# Patient Record
Sex: Female | Born: 1980 | Race: White | Hispanic: No | Marital: Married | Smoking: Never smoker
Health system: Southern US, Community
[De-identification: ages and names within clinical notes are randomized; demographics above are authoritative.]

## PROBLEM LIST (undated history)

## (undated) DIAGNOSIS — F419 Anxiety disorder, unspecified: Secondary | ICD-10-CM

## (undated) DIAGNOSIS — R32 Unspecified urinary incontinence: Secondary | ICD-10-CM

## (undated) HISTORY — PX: CHOLECYSTECTOMY: SHX55

## (undated) HISTORY — PX: EYE SURGERY: SHX253

## (undated) HISTORY — PX: TUBAL LIGATION: SHX77

## (undated) HISTORY — PX: HERNIA REPAIR: SHX51

---

## 2007-01-07 ENCOUNTER — Ambulatory Visit (HOSPITAL_COMMUNITY): Admission: RE | Admit: 2007-01-07 | Discharge: 2007-01-07 | Payer: Self-pay | Admitting: Optometry

## 2007-01-07 ENCOUNTER — Ambulatory Visit: Payer: Self-pay | Admitting: Internal Medicine

## 2007-01-19 ENCOUNTER — Encounter: Admission: RE | Admit: 2007-01-19 | Discharge: 2007-01-19 | Payer: Self-pay | Admitting: Emergency Medicine

## 2007-02-04 ENCOUNTER — Ambulatory Visit: Payer: Self-pay | Admitting: Internal Medicine

## 2007-08-03 ENCOUNTER — Ambulatory Visit (HOSPITAL_COMMUNITY): Admission: RE | Admit: 2007-08-03 | Discharge: 2007-08-03 | Payer: Self-pay | Admitting: Gastroenterology

## 2007-11-17 ENCOUNTER — Ambulatory Visit (HOSPITAL_COMMUNITY): Admission: RE | Admit: 2007-11-17 | Discharge: 2007-11-17 | Payer: Self-pay | Admitting: General Surgery

## 2007-11-17 ENCOUNTER — Encounter (INDEPENDENT_AMBULATORY_CARE_PROVIDER_SITE_OTHER): Payer: Self-pay | Admitting: General Surgery

## 2008-09-11 IMAGING — NM NM HEPATO W/GB/PHARM/[PERSON_NAME]
3 series · 13 of 13 positions shown · non-contrast
Comparison: Abdominal CT 01/19/2007

CLINICAL DATA: Abdominal pain with nausea

NUCLEAR MEDICINE HEPATOBILIARY IMAGING WITH/WITHOUT GALLBLADDER
EJECTION FRACTION.
TECHNIQUE: Sequential abdominal images were obtained following
intravenous injection of radiopharmaceutical.  Sequential images
were continued following the oral administration of 8 ounces of
half-and-half cream and a gallbladder ejection fraction was
calculated.
Radiopharmaceutical: 5 mCi technetium 99m Choletec.

[Series 1: he hepatobiliary · 3.22mm/px · 6 of 57 frames shown (1 of 3)]
[frame 5/57]
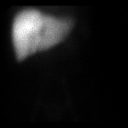
[frame 15/57]
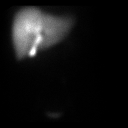
[frame 24/57]
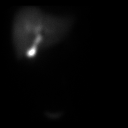
[frame 34/57]
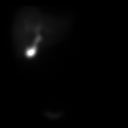
[frame 43/57]
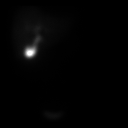
[frame 53/57]
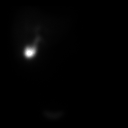

[Series 1: he hepatobiliary · 1 of 1 slices shown (2 of 3)]
[im 1/1]
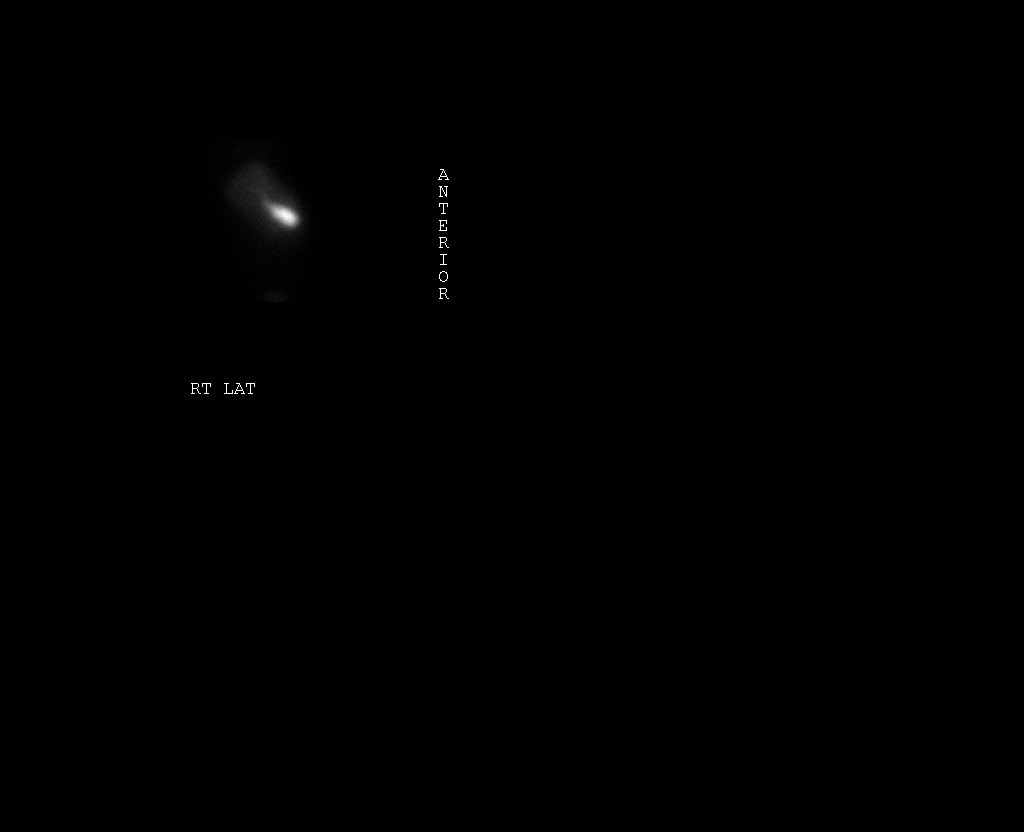

[Series 1: he hepatobiliary · 3.22mm/px · 6 of 60 frames shown (3 of 3)]
[frame 6/60]
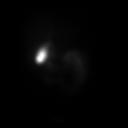
[frame 16/60]
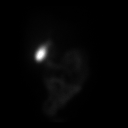
[frame 26/60]
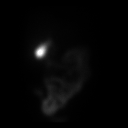
[frame 36/60]
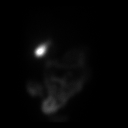
[frame 46/60]
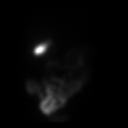
[frame 56/60]
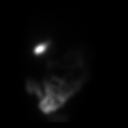

[13 of 13 positions shown; findings below may reference images not displayed]

FINDINGS: The initial images demonstrate homogeneous hepatic
activity and prompt filling of the gallbladder.

The stimulated portion of the study demonstrates gallbladder
contraction and progressive small bowel activity.  The gallbladder
ejection fraction calculated at 1 hour is 56%.  Values greater than
50% at1 hour are considered normal.
IMPRESSION: Normal examination with normal gallbladder ejection fraction of 56%
at 1 hour.

## 2009-08-14 ENCOUNTER — Encounter: Payer: Self-pay | Admitting: Internal Medicine

## 2009-08-27 ENCOUNTER — Telehealth (INDEPENDENT_AMBULATORY_CARE_PROVIDER_SITE_OTHER): Payer: Self-pay | Admitting: *Deleted

## 2010-05-27 ENCOUNTER — Encounter: Payer: Self-pay | Admitting: Emergency Medicine

## 2010-06-04 NOTE — Letter (Signed)
Summary: [facility] Office Note  [facility] Office Note   Imported By: Roderic Ovens 08/29/2009 15:58:13  _____________________________________________________________________  External Attachment:    Type:   Image     Comment:   External Document

## 2010-06-04 NOTE — Progress Notes (Signed)
  ROI recieved from Pt, Faxed LOV over to [facility]  August 27, 2009 10:26 AM

## 2010-09-17 NOTE — Letter (Signed)
February 04, 2007    Reuben Likes, M.D.  [address]  [place], [place] [address]   RE:  KRUTI, HORACEK  MRN:  [id number]  /  DOB:  Dec 27, 1980   Dear Onalee Hua:   It was a pleasure to see Lauren [name] at your request today regarding  spells with exertion.   As you know, she is a 30 year old married mother of two who has a really  complicated medical history.  I should also note first that she has seen  Dr. Rockne Menghini in [place], Dr. Orson Aloe in [facility], and has  undergone a variety of different types of testing to clarify these  spells.   Her past medical history is notable for asthma and she has a remote  history of blackouts with her first pregnancy in 2005.  She has a  history of orthostatic intolerance, heat intolerance, shower intolerance  and Jacuzzi intolerance, but what brought her to our attention was an  episode of syncope while exercising.   Similar spells have been occurring for some period of time.  She would  be doing different activities at the gym with a trainer and when she  would go from one activity to another she would develop lightheadedness,  tunnel vision, a funny sensation in her head.  If she sat down these  symptoms would resolve.  She should get back up and do another activity  and when she went from that activity to the next the symptoms would  likely recur.  Interestingly, she noted that they did not occur while  she was on a bike or treadmilling; that is, doing constant exercise.   Interestingly, the symptoms associated with her orthostatic intolerance,  shower and Jacuzzi intolerance gives rise to the same funny feeling in  her head.   Her cardiac evaluation here today has included an echo, stress testing,  all of which have been normal.  She actually was submitted for CPX  testing, done by Dr. Gala Romney, that was also normal.   PAST MEDICAL HISTORY:  Notable for:  1. Anxiety.  2. Endometriosis.  3. Urinary tract infections.  4. Irritable bowel syndrome.  5. Fatigue.  6. Asthma.  7. Constipation.   PAST SURGICAL HISTORY:  Notable for:  1. C-sections.  2. LASIK.  3. Wisdom tooth surgery.   CURRENT MEDICATIONS:  1. Lexapro 10.  2. Xyzal.  3. Symbicort.  4. Birth control pills.  5. Started today on Klonopin.   She has no known drug allergies.   SOCIAL HISTORY:  1. She is married.  2. She has two children.  3. She denies cigarettes, alcohol or recreational drugs.  4. 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   EXAMINATION:  She is an obese, young Caucasian female appearing her  stated of 9.  Her weight is 170+ pounds, she is 5 feet 5 inches.  Her  blood pressure is 160/89 with a pulse of 81 lying.  Sitting at 0 minutes  it was 133/90 with a pulse of 82 with a sensation of whooziness.  At 0  minutes of standing it was 96 with a blood pressure of 152/96 and at 5  minutes it was 112 with a blood pressure of 138/96.  HEENT EXAM:  No icterus or xanthoma.  The neck veins were flat, the  carotids were brisk and full bilaterally without bruits.  BACK:  Without kyphosis, scoliosis.  LUNGS:  Clear.  HEART SOUNDS:  Regular, without murmurs or gallops.  ABDOMEN:  Soft with active bowel sounds, without midline pulsation or  hepatomegaly.  Femoral pulses were 2+,  distal pulses were intact.  There is no clubbing, cyanosis or edema.  NEUROLOGICAL EXAM:  Grossly normal.   Electrocardiogram dated today demonstrated sinus rhythm at 85 with  intervals of 0.14/0/0.10/0.37.  Electrocardiogram was otherwise normal.   IMPRESSION:  1. Postural orthostatic tachycardia syndrome.  2. Hypertension.  3. Obesity.  4. Question sleep apnea.  5. Question depression.  6. Asthma.   Theodoro Grist, Ms. Wyly has spells of lightheadedness, presyncope while  exerting herself.  This lightheadedness comes on mostly between the  exercises themselves; that is, going from exercise to another.  As she  notes, she does not have  it occur with ongoing stress.  Her vital signs  today are consistent with postural orthostatic tachycardia and this  interspersed episode of lightheadedness would be consistent with  lightheadedness associated with vasodilatation augmented by the  termination of exercise.   Therapy for this is complicated in this young lady because of her  significant hypertension.  Typically, the treatment for POTS is salt and  fluid repletion, some beta-blocker therapy, sometimes Florinef and/or  midodrine.  Obviously, salt water, midodrine and/or Florinef is  contraindicated given her hypertension, beta-blockers might work.  I am  bothered though, by the degree of hypertension in her and I have  suggested to her that maybe exploration for secondary causes of  hypertension would be appropriate, and I took the liberty of giving her  Rosanne Ashing Deterding's name to explore that.   As it relates to other interventions that might ameliorate her symptoms,  I suggest that exercising in a pool would be helpful as the pressure  generated by the water against her legs helps to prevent venous pooling.  She does have to be very careful upon exiting the pool.  Further  continuous exercise as treadmilling and/or biking is going to be easier  for her than these interval workouts that she has been doing heretofore.  Only one of these episodes has been associated with chest pain.  She has  had a negative stress test and, as I think about her more, it may be  appropriate to pursue her CT scan of her coronary anatomy to make sure  she does not have an anomalous coronary artery, although I think the  likelihood of that is extremely small.   Theodoro Grist, thanks very much for asking me to see her.  At this point, I have  suggested that she:  1. Follow up with Rosanne Ashing Deterding.  2. Follow up with you.  3. We can think about a beta-blocker if need be.  4. Consider the CT scan also.    Sincerely,      Duke Salvia, MD, [facility]   Electronically Signed    SCK/MedQ  DD: 02/04/2007  DT: 02/05/2007  Job #: [id number]   CC:    Amelia Jo, MD

## 2010-09-17 NOTE — Op Note (Signed)
Lauren [name], [name]             ACCOUNT NO.:  [id number]   MEDICAL RECORD NO.:  [id number]          PATIENT TYPE:  AMB   LOCATION:  SDS                          FACILITY:  [facility]   PHYSICIAN:  Cherylynn Ridges, M.D.    DATE OF BIRTH:  26-Aug-1980   DATE OF PROCEDURE:  11/17/2007  DATE OF DISCHARGE:  11/17/2007                               OPERATIVE REPORT   PREOPERATIVE DIAGNOSIS:  Symptomatic biliary dyskinesia.   POSTOPERATIVE DIAGNOSIS:  Symptomatic biliary dyskinesia.   PROCEDURE:  Laparoscopic cholecystectomy.   SURGEON:  Cherylynn Ridges, MD.   ASSISTANT:  Juanetta Gosling, MD   ANESTHESIA:  General endotracheal.   ESTIMATED BLOOD LOSS:  Less than 20 mL.   COMPLICATIONS:  None.   CONDITION:  Stable.   FINDINGS:  The patient had no evidence of acute cholecystitis.  Cholangiogram could not be performed because the cystic duct was very  small, not allowing the passage of the tip of the [facility] catheter.   INDICATIONS FOR OPERATION:  The patient is a 30 year old with classic  postprandial right upper quadrant pain and also pain upon drinking half-  and-half for HIDA scan, who now comes in for an elective laparoscopic  cholecystectomy.   OPERATION:  The patient was taken to the operating room and placed on  the table in supine position.  After an adequate general endotracheal  anesthetic was administered, she was prepped and draped in the usual  sterile manner exposing midline.   An infraumbilical longitudinal incision was made approximately 1.5 cm  long down to the subcutaneous tissue.  We dissected down to the midline  fascia and then grabbed the fascia with Kocher clamps, made an incision  on the fascia with a 15 blade and bluntly dissected down into the  peritoneal cavity with the Kelly clamp.  With the edges of the fascial  opening control, we placed a purse-string suture of 0 Vicryl around the  fascial opening, then passed a Hasson cannula into the peritoneal  cavity, which was secured in place with a purse-string suture.  We  insufflated carbon dioxide gas up to a maximal intra-abdominal pressure  of 50 mmHg.  We then passed 2 right costal margin 5-mm cannulas and a  subxiphoid 11-mm cannula under direct vision into the peritoneal cavity.  With all cannulas in place, the patient was placed in reverse  Trendelenburg.  The left side was tilted down.   The dome of the gallbladder, which was not acutely inflamed, was grabbed  with a ratcheted grasper through the lateral-most 5-mm cannula site.  We  retracted towards the anterior abdominal wall and right upper quadrant.  A second grasper was placed on the infundibulum.  Then, we dissected out  the peritoneum overlying the triangle of Calot and hepatoduodenal  triangle.   The cystic duct and the cystic artery both were identified and isolated  with windows around each that were adequately controlled.  A clip was  placed along the gallbladder side and 2 clips were placed proximally and  distally on the cystic artery, which was subsequently transected.  A  cholecystodochotomy was  made using a laparoscopic scissors.  However,  the opening of the cystic duct was minuscule, but not allowing the  passage of the [facility] catheter, which had been passed through the anterior  abdominal wall.   We made second cut with a laparoscopic scissors just distal to the  initial cholecystodochotomy where there was minimal opening and a small  amount of bilious leakage.  Therefore, as we were approaching the common  duct, we decided to abort attempts at cholangiogram, place 2 clips on  the distal cystic duct, transect cystic duct and dissected out the  gallbladder from its bed with minimal difficulty, although there was a  tear of the wall allowing the spillage of bile from the gallbladder  itself.   We irrigated and aspirated all of biliary fluid from the gallbladder  fossa and right upper quadrant.  We removed the  gallbladder itself and  then completely detached from its bed using an Endocatch bag and there  was a minimal bleeding from the gallbladder bed.  Once the gallbladder  was removed, we closed off the infraumbilical fascial site using a purse-  string suture which was in place.  Once this was done, we injected 0.25%  Marcaine with epinephrine into all wounds.  We closed the infraumbilical  and subxiphoid sites with running subcuticular stitch of 4-0 Vicryl.  Marcaine was injected into all sites.  The lateral cannula sites were  closed with Dermabond only.  Dermabond, Steri-Strips, and Tegaderm were  applied to all wounds.  All needle counts, sponge counts, and instrument  counts were correct.      Cherylynn Ridges, M.D.  Electronically Signed     JOW/MEDQ  D:  11/17/2007  T:  11/17/2007  Job:  [id number]   cc:   Reuben Likes, M.D.  Anselmo Rod, M.D.

## 2011-01-30 LAB — COMPREHENSIVE METABOLIC PANEL
AST: 18
Albumin: 3.4 — ABNORMAL LOW
BUN: 9
Calcium: 8.9
Chloride: 104
Creatinine, Ser: 0.72
GFR calc Af Amer: >60
Total Protein: 6.3

## 2011-01-30 LAB — DIFFERENTIAL
Basophils Absolute: 0
Eosinophils Relative: 1
Lymphocytes Relative: 32
Lymphs Abs: 1.8
Monocytes Absolute: 0.3
Monocytes Relative: 5
Neutro Abs: 3.5

## 2011-01-30 LAB — CBC
HCT: 36.8
MCHC: 33.9
MCV: 86.1
Platelets: 215
RDW: 12.7
WBC: 5.7

## 2011-06-17 ENCOUNTER — Other Ambulatory Visit: Payer: Self-pay | Admitting: Physician Assistant

## 2011-06-17 MED ORDER — ALPRAZOLAM 0.5 MG PO TBDP: ORAL_TABLET | ORAL | Status: DC

## 2011-06-25 ENCOUNTER — Telehealth: Payer: Self-pay

## 2011-06-25 NOTE — Telephone Encounter (Signed)
[facility] clarification on pt rx for xanxa should it be extended release 0.5mg  or not  Extended release 0.5mg 

## 2011-06-26 NOTE — Telephone Encounter (Signed)
Called and clarified with pharmacist that xanax 0.5 if supposed to be ER per Alycia Rossetti.

## 2011-07-21 ENCOUNTER — Encounter: Payer: Self-pay | Admitting: Physician Assistant

## 2011-07-21 ENCOUNTER — Other Ambulatory Visit: Payer: Self-pay | Admitting: Physician Assistant

## 2011-07-21 ENCOUNTER — Telehealth: Payer: Self-pay | Admitting: Physician Assistant

## 2011-07-21 MED ORDER — ESCITALOPRAM OXALATE 20 MG PO TABS: 20.0000 mg | ORAL_TABLET | Freq: Every day | ORAL | Status: AC

## 2011-07-21 MED ORDER — ALPRAZOLAM 0.5 MG PO TBDP: ORAL_TABLET | ORAL | Status: DC
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
## 2011-07-21 NOTE — Progress Notes (Signed)
Addended by: Fernande Bras on: 07/21/2011 02:48 PM   Modules accepted: Orders

## 2011-07-29 ENCOUNTER — Telehealth: Payer: Self-pay

## 2011-07-29 NOTE — Telephone Encounter (Signed)
Pt LM on my VM reporting that we had sent in the incorrect form of her Xanax 0.5 mg. Instead of the XR tablet, we had sent in the dissolvable tablet and it is not XR and she cannot take it. Called pharmacy and gave new Rx info as documented in last telephone message by [facility], and told pharmacy that pt needs to turn in her incorrect Rx to them in order to p/up new Rx. Notified pt of this.

## 2011-12-04 ENCOUNTER — Other Ambulatory Visit: Payer: Self-pay | Admitting: Physician Assistant

## 2011-12-05 ENCOUNTER — Other Ambulatory Visit: Payer: Self-pay | Admitting: Family Medicine

## 2011-12-05 NOTE — Telephone Encounter (Signed)
Chart pulled °

## 2011-12-05 NOTE — Progress Notes (Signed)
This encounter was created in error - please disregard.

## 2012-01-25 ENCOUNTER — Emergency Department (HOSPITAL_COMMUNITY)
Admission: EM | Admit: 2012-01-25 | Discharge: 2012-01-25 | Disposition: A | Discharge status: Home / Self Care | Payer: BC Managed Care – PPO | Attending: Emergency Medicine | Admitting: Emergency Medicine

## 2012-01-25 ENCOUNTER — Emergency Department (HOSPITAL_COMMUNITY): Payer: BC Managed Care – PPO

## 2012-01-25 ENCOUNTER — Encounter (HOSPITAL_COMMUNITY): Payer: Self-pay

## 2012-01-25 DIAGNOSIS — K5289 Other specified noninfective gastroenteritis and colitis: Secondary | ICD-10-CM | POA: Insufficient documentation

## 2012-01-25 DIAGNOSIS — K529 Noninfective gastroenteritis and colitis, unspecified: Secondary | ICD-10-CM

## 2012-01-25 DIAGNOSIS — K5900 Constipation, unspecified: Secondary | ICD-10-CM | POA: Insufficient documentation

## 2012-01-25 HISTORY — DX: Anxiety disorder, unspecified: F41.9

## 2012-01-25 HISTORY — DX: Unspecified urinary incontinence: R32

## 2012-01-25 LAB — URINALYSIS, ROUTINE W REFLEX MICROSCOPIC
Glucose, UA: NEGATIVE mg/dL
Ketones, ur: NEGATIVE mg/dL
Leukocytes, UA: NEGATIVE
Specific Gravity, Urine: 1.01 (ref 1.005–1.030)
pH: 7 (ref 5.0–8.0)

## 2012-01-25 LAB — COMPREHENSIVE METABOLIC PANEL
BUN: 13 mg/dL (ref 6–23)
CO2: 27 mEq/L (ref 19–32)
Calcium: 9.5 mg/dL (ref 8.4–10.5)
Glucose, Bld: 90 mg/dL (ref 70–99)
Potassium: 3.9 mEq/L (ref 3.5–5.1)
Sodium: 133 mEq/L — ABNORMAL LOW (ref 135–145)
Total Bilirubin: 0.3 mg/dL (ref 0.3–1.2)

## 2012-01-25 LAB — CBC WITH DIFFERENTIAL/PLATELET
Eosinophils Absolute: 0 10*3/uL (ref 0.0–0.7)
Eosinophils Relative: 1 % (ref 0–5)
Hemoglobin: 12.8 g/dL (ref 12.0–15.0)
Lymphocytes Relative: 24 % (ref 12–46)
Lymphs Abs: 1.6 10*3/uL (ref 0.7–4.0)
MCH: 29.5 pg (ref 26.0–34.0)
MCV: 87.8 fL (ref 78.0–100.0)
Monocytes Relative: 5 % (ref 3–12)
Platelets: 200 10*3/uL (ref 150–400)
RBC: 4.34 MIL/uL (ref 3.87–5.11)
WBC: 6.7 10*3/uL (ref 4.0–10.5)

## 2012-01-25 LAB — PREGNANCY, URINE: Preg Test, Ur: NEGATIVE

## 2012-01-25 LAB — URINE MICROSCOPIC-ADD ON

## 2012-01-25 MED ORDER — IOHEXOL 300 MG/ML  SOLN
100.0000 mL | Freq: Once | INTRAMUSCULAR | Status: AC | PRN
  Administered 2012-01-25: 100 mL via INTRAVENOUS

## 2012-01-25 MED ORDER — METRONIDAZOLE 500 MG PO TABS: 500.0000 mg | ORAL_TABLET | Freq: Four times a day (QID) | ORAL | Status: AC

## 2012-01-25 MED ORDER — LEVOFLOXACIN 500 MG PO TABS: 750.0000 mg | ORAL_TABLET | Freq: Every day | ORAL | Status: AC

## 2012-01-25 NOTE — ED Provider Notes (Signed)
History  This chart was scribed for Donnetta Hutching, MD by Erskine Emery. This patient was seen in room APA11/APA11 and the patient's care was started at 17:31.   CSN: [id number]  Arrival date & time 01/25/12  1717   First MD Initiated Contact with Patient 01/25/12 1731      Chief Complaint  Patient presents with  . Abdominal Pain  . Constipation    (Consider location/radiation/quality/duration/timing/severity/associated sxs/prior treatment) The history is provided by the patient. No language interpreter was used.  Lauren [name] is a 31 y.o. female who presents to the Emergency Department complaining of gradually worsening constipation, abdominal pain, bloating, and nausea since this morning. Pt denies any associated fever, emesis, or diet changes. Pt reports she has chronic constipation and normally only has a bowel movement every 1-2 weeks but it has been worse this week than ever. Pt reports she never feels the need to use the restroom, never passes gas, and only ever has a bowel movement when something aggravates her stomach. This week the pt did several enemas along with 100 mg of Colace, suppositories, and a magnesium citrate laxative today with only mild relief from symptoms. Pt does report a massive soft liquidy bowel movement on Tuesday (6 days ago); this was her last bowel movement. Pt has been seeing Dr. Melvyn Neth at [facility]. Dr. Melvyn Neth put her on a weight loss drug that exacerbated her complications.  Pt has a h/o 2 cesarian sections, a cholecystectomy, a hernia repair, and a tummy tuck.  Pt has no h/o DM or HTN.  Pt lives in [place].   Past Medical History  Diagnosis Date  . Anxiety   . Urinary incontinence     Past Surgical History  Procedure Date  . Cesarean section   . Hernia repair   . Cholecystectomy   . Eye surgery   . Tubal ligation     No family history on file.  History  Substance Use Topics  . Smoking status:  Never Smoker   . Smokeless tobacco: Not on file  . Alcohol Use: No    OB History    Grav Para Term Preterm Abortions TAB SAB Ect Mult Living                  Review of Systems A complete 10 system review of systems was obtained and all systems are negative except as noted in the HPI and PMH.    Allergies  Review of patient's allergies indicates not on file.  Home Medications   Current Outpatient Rx  Name Route Sig Dispense Refill  . ALPRAZOLAM 0.5 MG PO TBDP  1 tab up to 3X daily prn 90 tablet 0  . ALPRAZOLAM ER 0.5 MG PO TB24 Oral Take 1 tablet (0.5 mg total) by mouth 3 (three) times daily. Needs office visit 30 tablet 0    Triage Vitals: BP 136/85  Pulse 96  Temp 97.6 F (36.4 C) (Oral)  Resp 18  Ht 5\' 5"  (1.651 m)  Wt 174 lb (78.926 kg)  BMI 28.96 kg/m2  SpO2 100%  LMP 01/02/2012  Physical Exam  Nursing note and vitals reviewed. Constitutional: She is oriented to person, place, and time. She appears well-developed and well-nourished.  HENT:  Head: Normocephalic and atraumatic.  Eyes: Conjunctivae normal and EOM are normal. Pupils are equal, round, and reactive to light.  Neck: Normal range of motion. Neck supple.  Cardiovascular: Normal rate, regular rhythm and normal heart sounds.  Pulmonary/Chest: Effort normal and breath sounds normal.  Abdominal: Soft. Bowel sounds are normal. There is tenderness.       Abdomen is soft and slightly tender.   Genitourinary: Rectal exam shows no external hemorrhoid and no mass.       No obvious external or internal hemorrhoids. Hemoccult negative.  Musculoskeletal: Normal range of motion.  Neurological: She is alert and oriented to person, place, and time.  Skin: Skin is warm and dry.  Psychiatric: She has a normal mood and affect.    ED Course  Procedures (including critical care time) DIAGNOSTIC STUDIES: Oxygen Saturation is 100% on room air, normal by my interpretation.    COORDINATION OF CARE: 18:30--I evaluated  the patient and we discussed a treatment plan including a rectal exam, blood work, and CT scan (with contrast) to which the pt agreed. I informed the pt of the negative results of her x-ray and discussed with her foods that are good for encouraging healthy bowel movements. I encouraged the pt to find a gastrointestinal specialist and told her that she is at higher risk of obstruction because of her h/o abdominal surgery. Pt presents with films from a x-ray taken on the 13th (9 days ago) that show constipation.  18:36--I performed the rectal exam and obtained hemoccult sample.   Results for orders placed during the hospital encounter of 01/25/12  OCCULT BLOOD, POC DEVICE      Component Value Range   Fecal Occult Bld NEGATIVE    CBC WITH DIFFERENTIAL      Component Value Range   WBC 6.7  4.0 - 10.5 K/uL   RBC 4.34  3.87 - 5.11 MIL/uL   Hemoglobin 12.8  12.0 - 15.0 g/dL   HCT 11.9  14.7 - 82.9 %   MCV 87.8  78.0 - 100.0 fL   MCH 29.5  26.0 - 34.0 pg   MCHC 33.6  30.0 - 36.0 g/dL   RDW 56.2  13.0 - 86.5 %   Platelets 200  150 - 400 K/uL   Neutrophils Relative 71  43 - 77 %   Neutro Abs 4.7  1.7 - 7.7 K/uL   Lymphocytes Relative 24  12 - 46 %   Lymphs Abs 1.6  0.7 - 4.0 K/uL   Monocytes Relative 5  3 - 12 %   Monocytes Absolute 0.3  0.1 - 1.0 K/uL   Eosinophils Relative 1  0 - 5 %   Eosinophils Absolute 0.0  0.0 - 0.7 K/uL   Basophils Relative 0  0 - 1 %   Basophils Absolute 0.0  0.0 - 0.1 K/uL  COMPREHENSIVE METABOLIC PANEL      Component Value Range   Sodium 133 (*) 135 - 145 mEq/L   Potassium 3.9  3.5 - 5.1 mEq/L   Chloride 98  96 - 112 mEq/L   CO2 27  19 - 32 mEq/L   Glucose, Bld 90  70 - 99 mg/dL   BUN 13  6 - 23 mg/dL   Creatinine, Ser 7.84  0.50 - 1.10 mg/dL   Calcium 9.5  8.4 - 69.6 mg/dL   Total Protein 7.2  6.0 - 8.3 g/dL   Albumin 3.8  3.5 - 5.2 g/dL   AST 15  0 - 37 U/L   ALT 11  0 - 35 U/L   Alkaline Phosphatase 74  39 - 117 U/L   Total Bilirubin 0.3  0.3 - 1.2  mg/dL   GFR calc non Af Amer >  90  >90 mL/min   GFR calc Af Amer >90  >90 mL/min  LIPASE, BLOOD      Component Value Range   Lipase 32  11 - 59 U/L  URINALYSIS, ROUTINE W REFLEX MICROSCOPIC      Component Value Range   Color, Urine YELLOW  YELLOW   APPearance CLEAR  CLEAR   Specific Gravity, Urine 1.010  1.005 - 1.030   pH 7.0  5.0 - 8.0   Glucose, UA NEGATIVE  NEGATIVE mg/dL   Hgb urine dipstick TRACE (*) NEGATIVE   Bilirubin Urine NEGATIVE  NEGATIVE   Ketones, ur NEGATIVE  NEGATIVE mg/dL   Protein, ur NEGATIVE  NEGATIVE mg/dL   Urobilinogen, UA 0.2  0.0 - 1.0 mg/dL   Nitrite NEGATIVE  NEGATIVE   Leukocytes, UA NEGATIVE  NEGATIVE  PREGNANCY, URINE      Component Value Range   Preg Test, Ur NEGATIVE  NEGATIVE  URINE MICROSCOPIC-ADD ON      Component Value Range   Squamous Epithelial / LPF RARE  RARE   WBC, UA 3-6  <3 WBC/hpf   RBC / HPF 3-6  <3 RBC/hpf   Ct Abdomen Pelvis W Contrast  01/25/2012  *RADIOLOGY REPORT*  Clinical Data: Abdominal pain.  Constipation.  History of cholecystectomy, hernia repair.  CT ABDOMEN AND PELVIS WITH CONTRAST  Technique:  Multidetector CT imaging of the abdomen and pelvis was performed following the standard protocol during bolus administration of intravenous contrast.  Contrast: OMNIPAQUE IOHEXOL 300 MG/ML  SOLN  Comparison: 01/19/2007.  Findings: Lung Bases: Partial visualization of breast implants. Lung bases clear.  Liver:  Liver normal.  Spleen:  Normal.  Gallbladder:  Cholecystectomy.  Common bile duct:  Normal.  Pancreas:  Normal.  Adrenal glands:  Normal.  Kidneys:  Nonobstructing 2 mm right  upper pole renal collecting system calculus.  Normal enhancement and excretion.  The ureters appear within normal limits.  Stomach:  Normal.  Small bowel:  Normal.  No dilation.  Colon:   Normal appendix.  Fluid is present throughout the colon, which is nonspecific but most commonly associated with enteric infection.  Adherent stool versus mild thickening  in the cecum.  No colonic inflammatory changes.  Pelvic Genitourinary:  Physiologic appearance of the uterus and adnexa.  Left corpus luteum cyst.  Urinary bladder normal.  No free fluid.  Bones:  No aggressive osseous lesions.  Vasculature: Normal.  IMPRESSION: 1.  Abnormal but nonspecific fluid levels within the colon, most commonly associated with enteric infection.   No pericolonic inflammatory changes.  2.  Cholecystectomy. 3.  2 mm nonobstructing right upper pole renal collecting system calculus.   Original Report Authenticated By: Andreas Newport, M.D.    Dg Abd Acute W/chest  01/25/2012  *RADIOLOGY REPORT*  Clinical Data: 31 year old female with abdominal pain constipation. Prior tubal ligation.  ACUTE ABDOMEN SERIES (ABDOMEN 2 VIEW & CHEST 1 VIEW)  Comparison: Report of the [facility] CT abdomen and pelvis 02/16/2009 and earlier .  Findings:  Lung parenchyma is within normal limits.  No pneumothorax or pneumoperitoneum.  Cardiac size and mediastinal contours are within normal limits.  Sequelae of cholecystectomy again noted. Nonobstructed bowel gas pattern.  There may be some air-fluid levels in the transverse colon which could reflect diarrhea or enema use.  Abdominal and pelvic visceral contours are within normal limits.  Stable very mild scoliosis. No acute osseous abnormality identified. Occasional tiny pelvic phleboliths are stable.  IMPRESSION: 1. Nonobstructed bowel gas pattern,  no free air. 2. No acute cardiopulmonary abnormality.   Original Report Authenticated By: Harley Hallmark, M.D.        No diagnosis found.    MDM  CT scan shows possible enteritis. Rx Levaquin 750 mg daily and metronidazole 500 mg every 6 hours for 10 days. Followup with gastroenterologist at home.   CD of CT scan given to patient      I personally performed the services described in this documentation, which was scribed in my presence. The recorded information has been reviewed and  considered.    Donnetta Hutching, MD 01/25/12 2226

## 2012-01-25 NOTE — ED Notes (Addendum)
Pt presents with c/o abdominal pain, fullness in abdomin and constipation. Pt states took mag-citrate today with little relief. Pt also takes colace daily per PCP. Pt denies diarrhea and vomiting. However does state she has been nausea today. Pt refers to her stools as ribbon like. Pt states has had multiple abdominal surgeries. NAD noted.

## 2012-01-25 NOTE — ED Notes (Signed)
Has been having problems w/ bowel movements for 1 month, has gotten worse over last 2 weeks, attempted mag citrate today with little results. +nausea, no vomiting, no fever. Has been seeing her pmd for treatment.

## 2014-08-24 ENCOUNTER — Emergency Department (HOSPITAL_COMMUNITY)
Admission: EM | Admit: 2014-08-24 | Discharge: 2014-08-24 | Disposition: A | Discharge status: Home / Self Care | Payer: PRIVATE HEALTH INSURANCE | Attending: Emergency Medicine | Admitting: Emergency Medicine

## 2014-08-24 ENCOUNTER — Encounter (HOSPITAL_COMMUNITY): Payer: Self-pay | Admitting: *Deleted

## 2014-08-24 DIAGNOSIS — Z3202 Encounter for pregnancy test, result negative: Secondary | ICD-10-CM | POA: Insufficient documentation

## 2014-08-24 DIAGNOSIS — F419 Anxiety disorder, unspecified: Secondary | ICD-10-CM | POA: Diagnosis not present

## 2014-08-24 DIAGNOSIS — Z79899 Other long term (current) drug therapy: Secondary | ICD-10-CM | POA: Insufficient documentation

## 2014-08-24 DIAGNOSIS — R0789 Other chest pain: Secondary | ICD-10-CM | POA: Diagnosis not present

## 2014-08-24 DIAGNOSIS — R202 Paresthesia of skin: Secondary | ICD-10-CM | POA: Insufficient documentation

## 2014-08-24 LAB — I-STAT BETA HCG BLOOD, ED (MC, WL, AP ONLY): I-stat hCG, quantitative: <5 m[IU]/mL (ref <5)

## 2014-08-24 LAB — I-STAT TROPONIN, ED: TROPONIN I, POC: 0 ng/mL (ref 0.00–0.08)

## 2014-08-24 MED ORDER — LORAZEPAM 0.5 MG PO TABS
1.0000 mg | ORAL_TABLET | Freq: Once | ORAL | Status: AC
  Administered 2014-08-24: 1 mg via ORAL
  Filled 2014-08-24: qty 2

## 2014-08-24 MED ORDER — IBUPROFEN 400 MG PO TABS
800.0000 mg | ORAL_TABLET | Freq: Once | ORAL | Status: AC
  Administered 2014-08-24: 800 mg via ORAL
  Filled 2014-08-24: qty 2
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
## 2014-08-24 NOTE — ED Notes (Signed)
Pt reports a significant increase in stress and anxiety since 2013. [place] is from [place], states [place] to come to [place] appointment then took daughter to urgent care. Pt states while she was waiting for a call from UC to get a different prescription for her daughter she started having left arm tingling, shortness of breath. Pt states left arm tingling has been relieved but now reports tingling in her lips. Pt is very tearful in triage, also reports an increase in forgetfulness since 2013

## 2014-08-24 NOTE — Discharge Instructions (Signed)
